# Patient Record
Sex: Female | Born: 1970 | Race: White | Hispanic: No | Marital: Married | Smoking: Never smoker
Health system: Southern US, Community
[De-identification: ages and names within clinical notes are randomized; demographics above are authoritative.]

## PROBLEM LIST (undated history)

## (undated) HISTORY — PX: SKIN CANCER EXCISION: SHX779

## (undated) HISTORY — PX: BREAST SURGERY: SHX581

## (undated) HISTORY — PX: ABDOMINAL HYSTERECTOMY: SHX81

## (undated) HISTORY — PX: CHOLECYSTECTOMY: SHX55

## (undated) HISTORY — PX: APPENDECTOMY: SHX54

---

## 2018-12-12 ENCOUNTER — Other Ambulatory Visit: Payer: Self-pay

## 2018-12-12 ENCOUNTER — Emergency Department (INDEPENDENT_AMBULATORY_CARE_PROVIDER_SITE_OTHER)
Admission: EM | Admit: 2018-12-12 | Discharge: 2018-12-12 | Disposition: A | Discharge status: Home / Self Care | Payer: No Typology Code available for payment source | Source: Home / Self Care | Attending: Family Medicine | Admitting: Family Medicine

## 2018-12-12 ENCOUNTER — Emergency Department (INDEPENDENT_AMBULATORY_CARE_PROVIDER_SITE_OTHER): Payer: No Typology Code available for payment source

## 2018-12-12 DIAGNOSIS — M549 Dorsalgia, unspecified: Secondary | ICD-10-CM

## 2018-12-12 DIAGNOSIS — R079 Chest pain, unspecified: Secondary | ICD-10-CM

## 2018-12-12 DIAGNOSIS — R110 Nausea: Secondary | ICD-10-CM

## 2018-12-12 DIAGNOSIS — R0789 Other chest pain: Secondary | ICD-10-CM

## 2018-12-12 DIAGNOSIS — R1013 Epigastric pain: Secondary | ICD-10-CM

## 2018-12-12 NOTE — ED Provider Notes (Signed)
[facility]    CSN: [id number] Arrival date & time: 12/12/18  1207      History   Chief Complaint Chief Complaint  Patient presents with  . Chest Pain    HPI Luticia Tadros is a 48 y.o. female.   HPI  Marta Bouie is a 48 y.o. female presenting to UC with c/o intermittent chest pain that woke her from her sleep about 10 days ago at 2AM. Pain was in the center lower part of her chest, sharp tight squeezing sensation, intense, radiated into her back and arm.  She initially thought she had slept wrong but the pain lasted for about 2 hours. Pt thinks at that point she was too afraid to fall back asleep. Denies breaking out into sweats, SOB or nausea at that time.  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  About 2 nights ago the pain woke her from her sleep again, associated mild nausea but no vomiting.  She has tried OTC famotidine and omeprazole w/o relief. She notes she has been taking increased dose of ibuprofen recently due to her back pain but not sure if that is the cause of her symptoms. Denies prior hx of heart problems and no family hx of heart problems. Her last physical was last August or September. She is in the process of finding another PCP due to insurance reasons.  She has never been diagnosed with high cholesterol, HTN or diabetes. Denies increased stress, recent illness or change in medications or OTC supplements.   Currently she has mild centralized lower chest pain, slightly to the left and into her back between her shoulders, 2/10.  Denies SOB, nausea, or abdominal pain. She had her gallbladder removed over 6 years ago as well as her appendix and a hysterectomy.     History reviewed. No pertinent past medical history.  There are no active problems to display for this patient.   Past Surgical History:  Procedure  Laterality Date  . ABDOMINAL HYSTERECTOMY    . APPENDECTOMY    . BREAST SURGERY    . CHOLECYSTECTOMY    . SKIN CANCER EXCISION      OB History   No obstetric history on file.      Home Medications    Prior to Admission medications   Not on File    Family History History reviewed. No pertinent family history.  Social History Social History   Tobacco Use  . Smoking status: Never Smoker  . Smokeless tobacco: Never Used  Substance Use Topics  . Alcohol use: Yes    Comment: 4-5 beer/ week  . Drug use: Not Currently     Allergies   Sulfa antibiotics   Review of Systems Review of Systems  Constitutional: Negative for chills, diaphoresis, fatigue and fever.  HENT: Negative for congestion, ear pain, sore throat, trouble swallowing and voice change.   Respiratory: Positive for chest tightness. Negative for cough, shortness of breath and wheezing.   Cardiovascular: Positive for chest pain. Negative for palpitations.  Gastrointestinal: Positive for nausea. Negative for abdominal pain, diarrhea and vomiting.  Musculoskeletal: Positive for back pain. Negative for arthralgias and myalgias.  Skin: Negative for rash.  Neurological: Negative for dizziness, light-headedness and headaches.     Physical Exam Triage Vital Signs ED Triage Vitals  Enc Vitals Group  BP 12/12/18 1224 123/78     Pulse Rate 12/12/18 1224 80     Resp 12/12/18 1224 20     Temp 12/12/18 1224 98.2 F (36.8 C)     Temp Source 12/12/18 1224 Temporal     SpO2 12/12/18 1224 97 %     Weight 12/12/18 1226 153 lb (69.4 kg)     Height 12/12/18 1226 5\' 4"  (1.626 m)     Head Circumference --      Peak Flow --      Pain Score 12/12/18 1226 2     Pain Loc --      Pain Edu? --      Excl. in GC? --    No data found.  Updated Vital Signs BP 123/78 (BP Location: Right Arm)   Pulse 80   Temp 98.2 F (36.8 C) (Temporal)   Resp 20   Ht 5\' 4"  (1.626 m)   Wt 153 lb (69.4 kg)   SpO2 97%   BMI 26.26  kg/m   Visual Acuity Right Eye Distance:   Left Eye Distance:   Bilateral Distance:    Right Eye Near:   Left Eye Near:    Bilateral Near:     Physical Exam Vitals signs and nursing note reviewed.  Constitutional:      General: She is not in acute distress.    Appearance: She is well-developed and normal weight. She is not ill-appearing, toxic-appearing or diaphoretic.  HENT:     Head: Normocephalic and atraumatic.  Neck:     Musculoskeletal: Normal range of motion.  Cardiovascular:     Rate and Rhythm: Normal rate and regular rhythm.     Heart sounds: Normal heart sounds.  Pulmonary:     Effort: Pulmonary effort is normal.     Breath sounds: No decreased breath sounds, wheezing, rhonchi or rales.  Abdominal:     Palpations: Abdomen is soft.     Tenderness: There is no abdominal tenderness.  Musculoskeletal: Normal range of motion.  Skin:    General: Skin is warm and dry.  Neurological:     Mental Status: She is alert and oriented to person, place, and time.  Psychiatric:        Behavior: Behavior normal.      UC Treatments / Results  Labs (all labs ordered are listed, but only abnormal results are displayed) Labs Reviewed - No data to display  EKG Date/Time:12/12/2018   12:35:36 Ventricular Rate: 65 PR Interval: 146 QRS Duration: 88 QT Interval: 386 QTC Calculation: 401 P-R-T axes: 56   62    52 Text Interpretation: Normal sinus rhythm. Normal ECG No prior to compare.  Radiology Dg Chest 2 View  Result Date: 12/12/2018 CLINICAL DATA:  48 year old female pain radiating into with history of chest the back for the past 1 and half weeks. EXAM: CHEST - 2 VIEW COMPARISON:  No priors. FINDINGS: Lung volumes are normal. No consolidative airspace disease. No pleural effusions. No pneumothorax. No pulmonary nodule or mass noted. Pulmonary vasculature and the cardiomediastinal silhouette are within normal limits. IMPRESSION: No radiographic evidence of acute  cardiopulmonary disease. Electronically Signed   By: 12/14/2018 M.D.   On: 12/12/2018 13:09    Procedures Procedures (including critical care time)  Medications Ordered in UC Medications - No data to display  Initial Impression / Assessment and Plan / UC Course  I have reviewed the triage vital signs and the nursing notes.  Pertinent labs & imaging results  that were available during my care of the patient were reviewed by me and considered in my medical decision making (see chart for details).     Reassured pt of normal EKG and vitals at this time. Pt is able to pinpoint location of chest pain, by pointing with 1 or 2 fingers but pain not reproducible with palpation. Given patient's age and combination of symptoms, recommend cardiology f/u despite normal EKG today. Especially because pt does not currently have a PCP for close f/u. No evidence of emergent process at this time, pt does not need to go to hospital today unless new or worsening symptoms develop. Discussed symptoms that warrant emergent care in the ED. Pt verbalized understanding and agreement with tx plan.  AVS provided   Final Clinical Impressions(s) / UC Diagnoses   Final diagnoses:  Nonspecific chest pain  Nausea without vomiting  Upper back pain  Abdominal pain, epigastric     Discharge Instructions      Although your EKG was normal today, it is recommended you have a more detailed cardiac workup.   A referral to cardiology has been placed.  Someone from a cardiology office should call you in about 1-2 days to schedule an appointment.  Please also try to follow up with a primary care provider for their guidance on recheck of symptoms and further workup if symptoms continue.  Call 911 or go to the hospital if symptoms worsening or new concerning symptoms develop- worsening chest pain, difficulty breathing, breaking out into sweats, vomiting, dizziness/passing out, etc.  No Primary Care Doctor: - Call  [facility] at  [phone] - they can help you locate a primary care doctor that  accepts your insurance, provides certain services, etc. - Physician Referral [phone]- [phone]     ED Prescriptions    None     PDMP not reviewed this encounter.   Lurene Shadowhelps, Ramiah Helfrich O, [place]-C 12/12/18 1453

## 2018-12-12 NOTE — Discharge Instructions (Signed)
°  Although your EKG was normal today, it is recommended you have a more detailed cardiac workup.   A referral to cardiology has been placed.  Someone from a cardiology office should call you in about 1-2 days to schedule an appointment.  Please also try to follow up with a primary care provider for their guidance on recheck of symptoms and further workup if symptoms continue.  Call 911 or go to the hospital if symptoms worsening or new concerning symptoms develop- worsening chest pain, difficulty breathing, breaking out into sweats, vomiting, dizziness/passing out, etc.  No Primary Care Doctor: Call [facility] at  [phone] - they can help you locate a primary care doctor that  accepts your insurance, provides certain services, etc. Physician Referral Service- [phone]

## 2018-12-12 NOTE — ED Triage Notes (Signed)
Chest pain a week ago Wednesday am around 2.  Arm felt funny, but thought it was from sleeping on arm.  Was not SOB, just intense pain.  Took pepto.  Stayed up a while, next day was not as bad.  Now pain in chest radiating to the back with slight nausea.

## 2018-12-22 ENCOUNTER — Ambulatory Visit (INDEPENDENT_AMBULATORY_CARE_PROVIDER_SITE_OTHER): Payer: No Typology Code available for payment source | Admitting: Cardiology

## 2018-12-22 ENCOUNTER — Other Ambulatory Visit: Payer: Self-pay

## 2018-12-22 ENCOUNTER — Encounter: Payer: Self-pay | Admitting: Cardiology

## 2018-12-22 VITALS — BP 118/72 | HR 71 | Ht 64.0 in | Wt 158.0 lb

## 2018-12-22 DIAGNOSIS — R079 Chest pain, unspecified: Secondary | ICD-10-CM

## 2018-12-22 DIAGNOSIS — Z13220 Encounter for screening for lipoid disorders: Secondary | ICD-10-CM | POA: Diagnosis not present

## 2018-12-22 DIAGNOSIS — R0789 Other chest pain: Secondary | ICD-10-CM | POA: Insufficient documentation

## 2018-12-22 DIAGNOSIS — Z1329 Encounter for screening for other suspected endocrine disorder: Secondary | ICD-10-CM | POA: Diagnosis not present

## 2018-12-22 NOTE — Addendum Note (Signed)
Addended by: Beckey Rutter on: 12/22/2018 05:00 PM   Modules accepted: Orders

## 2018-12-22 NOTE — Patient Instructions (Signed)
Medication Instructions:  Your physician recommends that you continue on your current medications as directed. Please refer to the Current Medication list given to you today.  *If you need a refill on your cardiac medications before your next appointment, please call your pharmacy*  Lab Work: Your physician recommends that you return FASTING to have a BMP, CBC, TSH, hepatic and lipid drawn   If you have labs (blood work) drawn today and your tests are completely normal, you will receive your results only by:  [place] (if you have MyChart) OR  A paper copy in the mail If you have any lab test that is abnormal or we need to change your treatment, we will call you to review the results.  Testing/Procedures: Your physician has requested that you have a lexiscan myoview. For further information please visit [email or web address]. Please follow instruction sheet, as given.  YOU will be contacted to schedule a CT calcium score at [address], [address], [place], [place]. THERE IS A $150 fee due upon arrival  Follow-Up: At [facility], you and your health needs are our priority.  As part of our continuing mission to provide you with exceptional heart care, we have created designated Provider Care Teams.  These Care Teams include your primary Cardiologist (physician) and Advanced Practice Providers (APPs -  Physician Assistants and Nurse Practitioners) who all work together to provide you with the care you need, when you need it.  Your next appointment:   6 months  The format for your next appointment:   In Person  Provider:   Jyl Heinz, MD  Other Instructions Regadenoson injection What is this medicine? REGADENOSON is used to test the heart for coronary artery disease. It is used in patients who can not exercise for their stress test. This medicine may be used for other purposes; ask your health care provider or pharmacist if you have questions. COMMON BRAND NAME(S):  Lexiscan What should I tell my health care provider before I take this medicine? They need to know if you have any of these conditions:  heart problems  lung or breathing disease, like asthma or COPD  an unusual or allergic reaction to regadenoson, other medicines, foods, dyes, or preservatives  pregnant or trying to get pregnant  breast-feeding How should I use this medicine? This medicine is for injection into a vein. It is given by a health care professional in a hospital or clinic setting. Talk to your pediatrician regarding the use of this medicine in children. Special care may be needed. Overdosage: If you think you have taken too much of this medicine contact a poison control center or emergency room at once. NOTE: This medicine is only for you. Do not share this medicine with others. What if I miss a dose? This does not apply. What may interact with this medicine?  caffeine  dipyridamole  guarana  theophylline This list may not describe all possible interactions. Give your health care provider a list of all the medicines, herbs, non-prescription drugs, or dietary supplements you use. Also tell them if you smoke, drink alcohol, or use illegal drugs. Some items may interact with your medicine. What should I watch for while using this medicine? Your condition will be monitored carefully while you are receiving this medicine. Do not take medicines, foods, or drinks with caffeine (like coffee, tea, or colas) for at least 12 hours before your test. If you do not know if something contains caffeine, ask your health care professional. What  side effects may I notice from receiving this medicine? Side effects that you should report to your doctor or health care professional as soon as possible:  allergic reactions like skin rash, itching or hives, swelling of the face, lips, or tongue  breathing problems  chest pain, tightness or palpitations  severe headache Side effects  that usually do not require medical attention (report to your doctor or health care professional if they continue or are bothersome):  flushing  headache  irritation or pain at site where injected  nausea, vomiting This list may not describe all possible side effects. Call your doctor for medical advice about side effects. You may report side effects to FDA at 1-800-FDA-1088. Where should I keep my medicine? This drug is given in a hospital or clinic and will not be stored at home. NOTE: This sheet is a summary. It may not cover all possible information. If you have questions about this medicine, talk to your doctor, pharmacist, or health care provider.  2020 Elsevier/Gold Standard (2007-10-11 15:08:13)   Cardiac Nuclear Scan A cardiac nuclear scan is a test that is done to check the flow of blood to your heart. It is done when you are resting and when you are exercising. The test looks for problems such as:  Not enough blood reaching a portion of the heart.  The heart muscle not working as it should. You may need this test if:  You have heart disease.  You have had lab results that are not normal.  You have had heart surgery or a balloon procedure to open up blocked arteries (angioplasty).  You have chest pain.  You have shortness of breath. In this test, a special dye (tracer) is put into your bloodstream. The tracer will travel to your heart. A camera will then take pictures of your heart to see how the tracer moves through your heart. This test is usually done at a hospital and takes 2-4 hours. Tell a doctor about:  Any allergies you have.  All medicines you are taking, including vitamins, herbs, eye drops, creams, and over-the-counter medicines.  Any problems you or family members have had with anesthetic medicines.  Any blood disorders you have.  Any surgeries you have had.  Any medical conditions you have.  Whether you are pregnant or may be pregnant. What are  the risks? Generally, this is a safe test. However, problems may occur, such as:  Serious chest pain and heart attack. This is only a risk if the stress portion of the test is done.  Rapid heartbeat.  A feeling of warmth in your chest. This feeling usually does not last long.  Allergic reaction to the tracer. What happens before the test?  Ask your doctor about changing or stopping your normal medicines. This is important.  Follow instructions from your doctor about what you cannot eat or drink.  Remove your jewelry on the day of the test. What happens during the test?  An IV tube will be inserted into one of your veins.  Your doctor will give you a small amount of tracer through the IV tube.  You will wait for 20-40 minutes while the tracer moves through your bloodstream.  Your heart will be monitored with an electrocardiogram (ECG).  You will lie down on an exam table.  Pictures of your heart will be taken for about 15-20 minutes.  You may also have a stress test. For this test, one of these things may be done: ? You  will be asked to exercise on a treadmill or a stationary bike. ? You will be given medicines that will make your heart work harder. This is done if you are unable to exercise.  When blood flow to your heart has peaked, a tracer will again be given through the IV tube.  After 20-40 minutes, you will get back on the exam table. More pictures will be taken of your heart.  Depending on the tracer that is used, more pictures may need to be taken 3-4 hours later.  Your IV tube will be removed when the test is over. The test may vary among doctors and hospitals. What happens after the test?  Ask your doctor: ? Whether you can return to your normal schedule, including diet, activities, and medicines. ? Whether you should drink more fluids. This will help to remove the tracer from your body. Drink enough fluid to keep your pee (urine) pale yellow.  Ask your  doctor, or the department that is doing the test: ? When will my results be ready? ? How will I get my results? Summary  A cardiac nuclear scan is a test that is done to check the flow of blood to your heart.  Tell your doctor whether you are pregnant or may be pregnant.  Before the test, ask your doctor about changing or stopping your normal medicines. This is important.  Ask your doctor whether you can return to your normal activities. You may be asked to drink more fluids. This information is not intended to replace advice given to you by your health care provider. Make sure you discuss any questions you have with your health care provider. Document Released: 07/27/2017 Document Revised: 06/02/2018 Document Reviewed: 07/27/2017 Elsevier Patient Education  2020 Elsevier Inc.  Coronary Calcium Scan A coronary calcium scan is an imaging test used to look for deposits of calcium and other fatty materials (plaques) in the inner lining of the blood vessels of the heart (coronary arteries). These deposits of calcium and plaques can partly clog and narrow the coronary arteries without producing any symptoms or warning signs. This puts a person at risk for a heart attack. This test can detect these deposits before symptoms develop. Tell a health care provider about:  Any allergies you have.  All medicines you are taking, including vitamins, herbs, eye drops, creams, and over-the-counter medicines.  Any problems you or family members have had with anesthetic medicines.  Any blood disorders you have.  Any surgeries you have had.  Any medical conditions you have.  Whether you are pregnant or may be pregnant. What are the risks? Generally, this is a safe procedure. However, problems may occur, including:  Harm to a pregnant woman and her unborn baby. This test involves the use of radiation. Radiation exposure can be dangerous to a pregnant woman and her unborn baby. If you are pregnant,  you generally should not have this procedure done.  Slight increase in the risk of cancer. This is because of the radiation involved in the test. What happens before the procedure? No preparation is needed for this procedure. What happens during the procedure?   You will undress and remove any jewelry around your neck or chest.  You will put on a hospital gown.  Sticky electrodes will be placed on your chest. The electrodes will be connected to an electrocardiogram (ECG) machine to record a tracing of the electrical activity of your heart.  A CT scanner will take pictures of your heart.  During this time, you will be asked to lie still and hold your breath for 2-3 seconds while a picture of your heart is being taken. The procedure may vary among health care providers and hospitals. What happens after the procedure?  You can get dressed.  You can return to your normal activities.  It is up to you to get the results of your test. Ask your health care provider, or the department that is doing the test, when your results will be ready. Summary  A coronary calcium scan is an imaging test used to look for deposits of calcium and other fatty materials (plaques) in the inner lining of the blood vessels of the heart (coronary arteries).  Generally, this is a safe procedure. Tell your health care provider if you are pregnant or may be pregnant.  No preparation is needed for this procedure.  A CT scanner will take pictures of your heart.  You can return to your normal activities after the scan is done. This information is not intended to replace advice given to you by your health care provider. Make sure you discuss any questions you have with your health care provider. Document Released: 08/09/2007 Document Revised: 01/23/2017 Document Reviewed: 12/31/2015 Elsevier Patient Education  2020 [employer].

## 2018-12-22 NOTE — Progress Notes (Signed)
Cardiology Office Note:    Date:  12/22/2018   ID:  Marissa Robertson, DOB 1970-07-20, MRN [id number]  PCP:  Patient, No Pcp Per  Cardiologist:  Garwin Brothers, MD   Referring MD: Lurene Shadow, PA-C    ASSESSMENT:    1. Chest discomfort   2. Screening cholesterol level    PLAN:    In order of problems listed above:  1. Chest discomfort: The patient symptoms are atypical for coronary etiology.  She does not have much in terms of risk factors for coronary artery disease.  She appears significantly anxious.  I reassured her about my findings today.  To make sure she has no element of obstructive coronary artery disease we will do a Lexiscan sestamibi.  Also she has not had any blood work and in the next few days we will get her in for fasting blood work including lipids for his stratification. 2. Her blood pressure is stable 3. I also discussed with her about calcium scoring for risk stratification and she is agreeable. 4. Patient will be seen in follow-up appointment in 6 months or earlier if the patient has any concerns.  She knows to go to the nearest emergency room for any concerning symptoms.   Medication Adjustments/Labs and Tests Ordered: Current medicines are reviewed at length with the patient today.  Concerns regarding medicines are outlined above.  No orders of the defined types were placed in this encounter.  No orders of the defined types were placed in this encounter.    History of Present Illness:    Marissa Robertson is a 48 y.o. female who is being seen today for the evaluation of chest discomfort at the request of Rolla Plate.  Patient is a pleasant 48 year old female.  She is overall in good health.  She mentions to me that she experience chest discomfort and woke up in the night about 3 weeks ago.  Subsequently she has had no significant issues.  No orthopnea or PND.  No radiation to any part of the body.  She started walking since and walked about half  an hour yesterday without any problems.  The previous day when she walked she had chest discomfort.  At the time of my evaluation, the patient is alert awake oriented and in no distress.  History reviewed. No pertinent past medical history.  Past Surgical History:  Procedure Laterality Date  . ABDOMINAL HYSTERECTOMY    . APPENDECTOMY    . BREAST SURGERY    . CHOLECYSTECTOMY    . SKIN CANCER EXCISION      Current Medications: No outpatient medications have been marked as taking for the 12/22/18 encounter (Office Visit) with Revankar, Aundra Dubin, MD.     Allergies:   Sulfa antibiotics   Social History   Socioeconomic History  . Marital status: Married    Spouse name: Not on file  . Number of children: Not on file  . Years of education: Not on file  . Highest education level: Not on file  Occupational History  . Not on file  Social Needs  . Financial resource strain: Not on file  . Food insecurity    Worry: Not on file    Inability: Not on file  . Transportation needs    Medical: Not on file    Non-medical: Not on file  Tobacco Use  . Smoking status: Never Smoker  . Smokeless tobacco: Never Used  Substance and Sexual Activity  . Alcohol use: Yes  Comment: 4-5 beer/ week  . Drug use: Not Currently  . Sexual activity: Not on file  Lifestyle  . Physical activity    Days per week: Not on file    Minutes per session: Not on file  . Stress: Not on file  Relationships  . Social Herbalist on phone: Not on file    Gets together: Not on file    Attends religious service: Not on file    Active member of club or organization: Not on file    Attends meetings of clubs or organizations: Not on file    Relationship status: Not on file  Other Topics Concern  . Not on file  Social History Narrative  . Not on file     Family History: The patient's family history is not on file.  ROS:   Please see the history of present illness.    All other systems reviewed  and are negative.  EKGs/Labs/Other Studies Reviewed:    The following studies were reviewed today: EKG was sinus rhythm and nonspecific ST-T changes.   Recent Labs: No results found for requested labs within last 8760 hours.  Recent Lipid Panel No results found for: CHOL, TRIG, HDL, CHOLHDL, VLDL, LDLCALC, LDLDIRECT  Physical Exam:    VS:  BP 118/72 (BP Location: Left Arm, Patient Position: Sitting, Cuff Size: Normal)   Pulse 71   Ht 5\' 4"  (1.626 m)   Wt 158 lb (71.7 kg)   SpO2 99%   BMI 27.12 kg/m     Wt Readings from Last 3 Encounters:  12/22/18 158 lb (71.7 kg)  12/12/18 153 lb (69.4 kg)     GEN: Patient is in no acute distress HEENT: Normal NECK: No JVD; No carotid bruits LYMPHATICS: No lymphadenopathy CARDIAC: S1 S2 regular, 2/6 systolic murmur at the apex. RESPIRATORY:  Clear to auscultation without rales, wheezing or rhonchi  ABDOMEN: Soft, non-tender, non-distended MUSCULOSKELETAL:  No edema; No deformity  SKIN: Warm and dry NEUROLOGIC:  Alert and oriented x 3 PSYCHIATRIC:  Normal affect    Signed, Jenean Lindau, MD  12/22/2018 4:47 PM    [facility]

## 2018-12-23 ENCOUNTER — Encounter (HOSPITAL_COMMUNITY): Payer: No Typology Code available for payment source

## 2018-12-30 ENCOUNTER — Telehealth: Payer: Self-pay | Admitting: *Deleted

## 2018-12-30 NOTE — Telephone Encounter (Signed)
Called pt to schedule CT CA Score for Dr. Geraldo Pitter not able to leave message voicemail full.

## 2018-12-31 ENCOUNTER — Telehealth: Payer: Self-pay

## 2018-12-31 NOTE — Telephone Encounter (Signed)
Left message for patient to call office to discuss calcium score scheduled.

## 2018-12-31 NOTE — Telephone Encounter (Signed)
-----   Message from Osvaldo Shipper, [place] sent at 12/30/2018  9:41 AM EST ----- Regarding: RE: Please call patient and schedule CT calcium score Katilyn Miltenberger   The above patients voicemail is full not sure if you want to mail a letter for her to call me directly to schedule he CT CA Score. [phone]  Erline Levine  ----- Message ----- From: Beckey Rutter, RN Sent: 12/22/2018   4:58 PM EST To: Osvaldo Shipper, [place] Subject: Please call patient and schedule CT calcium #

## 2019-02-02 ENCOUNTER — Telehealth (HOSPITAL_COMMUNITY): Payer: Self-pay

## 2019-02-02 NOTE — Telephone Encounter (Signed)
Encounter complete. 

## 2019-02-04 ENCOUNTER — Encounter (HOSPITAL_COMMUNITY): Payer: No Typology Code available for payment source

## 2020-08-25 ENCOUNTER — Emergency Department (HOSPITAL_BASED_OUTPATIENT_CLINIC_OR_DEPARTMENT_OTHER): Payer: 59

## 2020-08-25 ENCOUNTER — Other Ambulatory Visit: Payer: Self-pay

## 2020-08-25 ENCOUNTER — Emergency Department (HOSPITAL_BASED_OUTPATIENT_CLINIC_OR_DEPARTMENT_OTHER)
Admission: EM | Admit: 2020-08-25 | Discharge: 2020-08-25 | Disposition: A | Discharge status: Home / Self Care | Payer: 59 | Attending: Emergency Medicine | Admitting: Emergency Medicine

## 2020-08-25 ENCOUNTER — Encounter (HOSPITAL_BASED_OUTPATIENT_CLINIC_OR_DEPARTMENT_OTHER): Payer: Self-pay | Admitting: Emergency Medicine

## 2020-08-25 DIAGNOSIS — W108XXA Fall (on) (from) other stairs and steps, initial encounter: Secondary | ICD-10-CM | POA: Diagnosis not present

## 2020-08-25 DIAGNOSIS — Y92094 Garage of other non-institutional residence as the place of occurrence of the external cause: Secondary | ICD-10-CM | POA: Insufficient documentation

## 2020-08-25 DIAGNOSIS — S99912A Unspecified injury of left ankle, initial encounter: Secondary | ICD-10-CM | POA: Diagnosis present

## 2020-08-25 DIAGNOSIS — S93402A Sprain of unspecified ligament of left ankle, initial encounter: Secondary | ICD-10-CM | POA: Diagnosis not present

## 2020-08-25 DIAGNOSIS — Y9301 Activity, walking, marching and hiking: Secondary | ICD-10-CM | POA: Insufficient documentation

## 2020-08-25 MED ORDER — TRAMADOL HCL 50 MG PO TABS: 50.0000 mg | ORAL_TABLET | Freq: Four times a day (QID) | ORAL | 0 refills | Status: AC | PRN

## 2020-08-25 MED ORDER — IBUPROFEN 800 MG PO TABS: 800.0000 mg | ORAL_TABLET | Freq: Three times a day (TID) | ORAL | 0 refills | Status: AC | PRN

## 2020-08-25 MED ORDER — KETOROLAC TROMETHAMINE 30 MG/ML IJ SOLN
30.0000 mg | Freq: Once | INTRAMUSCULAR | Status: AC
  Administered 2020-08-25: 30 mg via INTRAMUSCULAR
  Filled 2020-08-25: qty 1

## 2020-08-25 NOTE — Discharge Instructions (Addendum)
You were seen in the emergency room today with pain in the ankle.  I suspect you have a sprain.  The x-ray did not show fracture.  You may use the crutches as needed for comfort.  Called in some medications to the pharmacy to help with pain.  Please follow with your primary care doctor.  Have also listed the name of a sports medicine doctor in the form for follow-up in the next several weeks if your pain is persistent.

## 2020-08-25 NOTE — ED Provider Notes (Signed)
Emergency Department Provider Note   I have reviewed the triage vital signs and the nursing notes.   HISTORY  Chief Complaint Leg Pain   HPI Marissa Robertson is a 50 y.o. female with past medical history reviewed below presents to the emergency department with left ankle pain and swelling.  The patient was walking down steps in the garage when her right foot slipped and the left leg folded underneath her.  She landed on the step with her left leg pinned under her.  She has pain in the left shin and left ankle with some swelling.  She took a tramadol prior to arrival with no significant relief in pain symptoms.  She is not having pain in the left knee or hip.  No back pain. Pain is moderate and worse with movement.   History reviewed. No pertinent past medical history.  Patient Active Problem List   Diagnosis Date Noted   Chest discomfort 12/22/2018   Screening cholesterol level 12/22/2018    Past Surgical History:  Procedure Laterality Date   ABDOMINAL HYSTERECTOMY     APPENDECTOMY     BREAST SURGERY     CHOLECYSTECTOMY     SKIN CANCER EXCISION      Allergies Sulfa antibiotics  No family history on file.  Social History Social History   Tobacco Use   Smoking status: Never   Smokeless tobacco: Never  Substance Use Topics   Alcohol use: Not Currently   Drug use: Not Currently    Review of Systems  Constitutional: No fever/chills Musculoskeletal: Negative for back pain. Positive left ankle pain/swelling.  Skin: Negative for rash. Neurological: Negative for headaches. No numbness.   ____________________________________________   PHYSICAL EXAM:  VITAL SIGNS: ED Triage Vitals  Enc Vitals Group     BP 08/25/20 2143 115/69     Pulse Rate 08/25/20 2143 71     Resp --      Temp 08/25/20 2143 98.5 F (36.9 C)     Temp Source 08/25/20 2143 Oral     SpO2 08/25/20 2143 98 %     Weight 08/25/20 2136 160 lb (72.6 kg)     Height 08/25/20 2136 5\' 4"  (1.626 m)    Constitutional: Alert and oriented. Well appearing and in no acute distress. Eyes: Conjunctivae are normal.  Head: Atraumatic. Nose: No congestion/rhinnorhea. Mouth/Throat: Mucous membranes are moist.  Neck: No stridor.  Cardiovascular: Good peripheral circulation. Respiratory: Normal respiratory effort.  Gastrointestinal: No distention.  Musculoskeletal: Swelling and tenderness over the left lateral malleolus.  There is some abrasion to the distal, anterior left lower leg. No laceration.  No proximal fibular tenderness. Neurologic:  Normal speech and language.  Skin:  Skin is warm, dry and intact. No rash noted.  ____________________________________________  RADIOLOGY  DG Tibia/Fibula Left  Result Date: 08/25/2020 CLINICAL DATA:  Fall down 3 steps in garage, left lower extremity pain EXAM: LEFT ANKLE COMPLETE - 3+ VIEW; LEFT TIBIA AND FIBULA - 2 VIEW COMPARISON:  None FINDINGS: Tibia and fibula are intact. Alignment at the knee is grossly maintained. No acute soft tissue swelling, gas or foreign body. No acute fracture or traumatic malalignment at the level of the ankle. Ankle mortise is congruent oblique radiographs. Included portion of the mid and hindfoot are unremarkable. No sizable ankle effusion or significant swelling. IMPRESSION: No acute fracture or traumatic malalignment of the tibia/fibula or ankle. Electronically Signed   By: 10/26/2020 M.D.   On: 08/25/2020 22:40   DG Ankle Complete  Left  Result Date: 08/25/2020 CLINICAL DATA:  Fall down 3 steps in garage, left lower extremity pain EXAM: LEFT ANKLE COMPLETE - 3+ VIEW; LEFT TIBIA AND FIBULA - 2 VIEW COMPARISON:  None FINDINGS: Tibia and fibula are intact. Alignment at the knee is grossly maintained. No acute soft tissue swelling, gas or foreign body. No acute fracture or traumatic malalignment at the level of the ankle. Ankle mortise is congruent oblique radiographs. Included portion of the mid and hindfoot are unremarkable.  No sizable ankle effusion or significant swelling. IMPRESSION: No acute fracture or traumatic malalignment of the tibia/fibula or ankle. Electronically Signed   By: Kreg Shropshire M.D.   On: 08/25/2020 22:40    ____________________________________________   PROCEDURES  Procedure(s) performed:   Procedures  None  ____________________________________________   INITIAL IMPRESSION / ASSESSMENT AND PLAN / ED COURSE  Pertinent labs & imaging results that were available during my care of the patient were reviewed by me and considered in my medical decision making (see chart for details).   Patient presents emergency department evaluation of left ankle and leg pain after a fall this evening.  No open fractures.  No proximal fibular tenderness.  Plan for plain films of the left ankle and tib-fib for evaluation of underlying fracture.  Plain films reviewed. No acute findings. Plan for RICE and crutches with sports med follow up PRN.  ____________________________________________  FINAL CLINICAL IMPRESSION(S) / ED DIAGNOSES  Final diagnoses:  Sprain of left ankle, unspecified ligament, initial encounter     MEDICATIONS GIVEN DURING THIS VISIT:  Medications  ketorolac (TORADOL) 30 MG/ML injection 30 mg (30 mg Intramuscular Given 08/25/20 2158)     NEW OUTPATIENT MEDICATIONS STARTED DURING THIS VISIT:  Discharge Medication List as of 08/25/2020 10:50 PM     START taking these medications   Details  ibuprofen (ADVIL) 800 MG tablet Take 1 tablet (800 mg total) by mouth every 8 (eight) hours as needed for moderate pain., Starting Sat 08/25/2020, Normal    traMADol (ULTRAM) 50 MG tablet Take 1 tablet (50 mg total) by mouth every 6 (six) hours as needed., Starting Sat 08/25/2020, Normal        Note:  This document was prepared using Dragon voice recognition software and may include unintentional dictation errors.  Alona Bene, MD, [facility]    Akirra Lacerda, Arlyss Repress, MD 08/26/20  [phone]

## 2020-08-25 NOTE — ED Triage Notes (Signed)
Pt fell down about 3 steps in garage tonight; c/o LLE pain

## 2022-12-07 IMAGING — DX DG ANKLE COMPLETE 3+V*L*
3 series · 3 of 3 positions shown · non-contrast
Comparison: None

CLINICAL DATA: Fall down 3 steps in garage, left lower extremity
pain

EXAM:
LEFT ANKLE COMPLETE - 3+ VIEW; LEFT TIBIA AND FIBULA - 2 VIEW

[ankle ap]
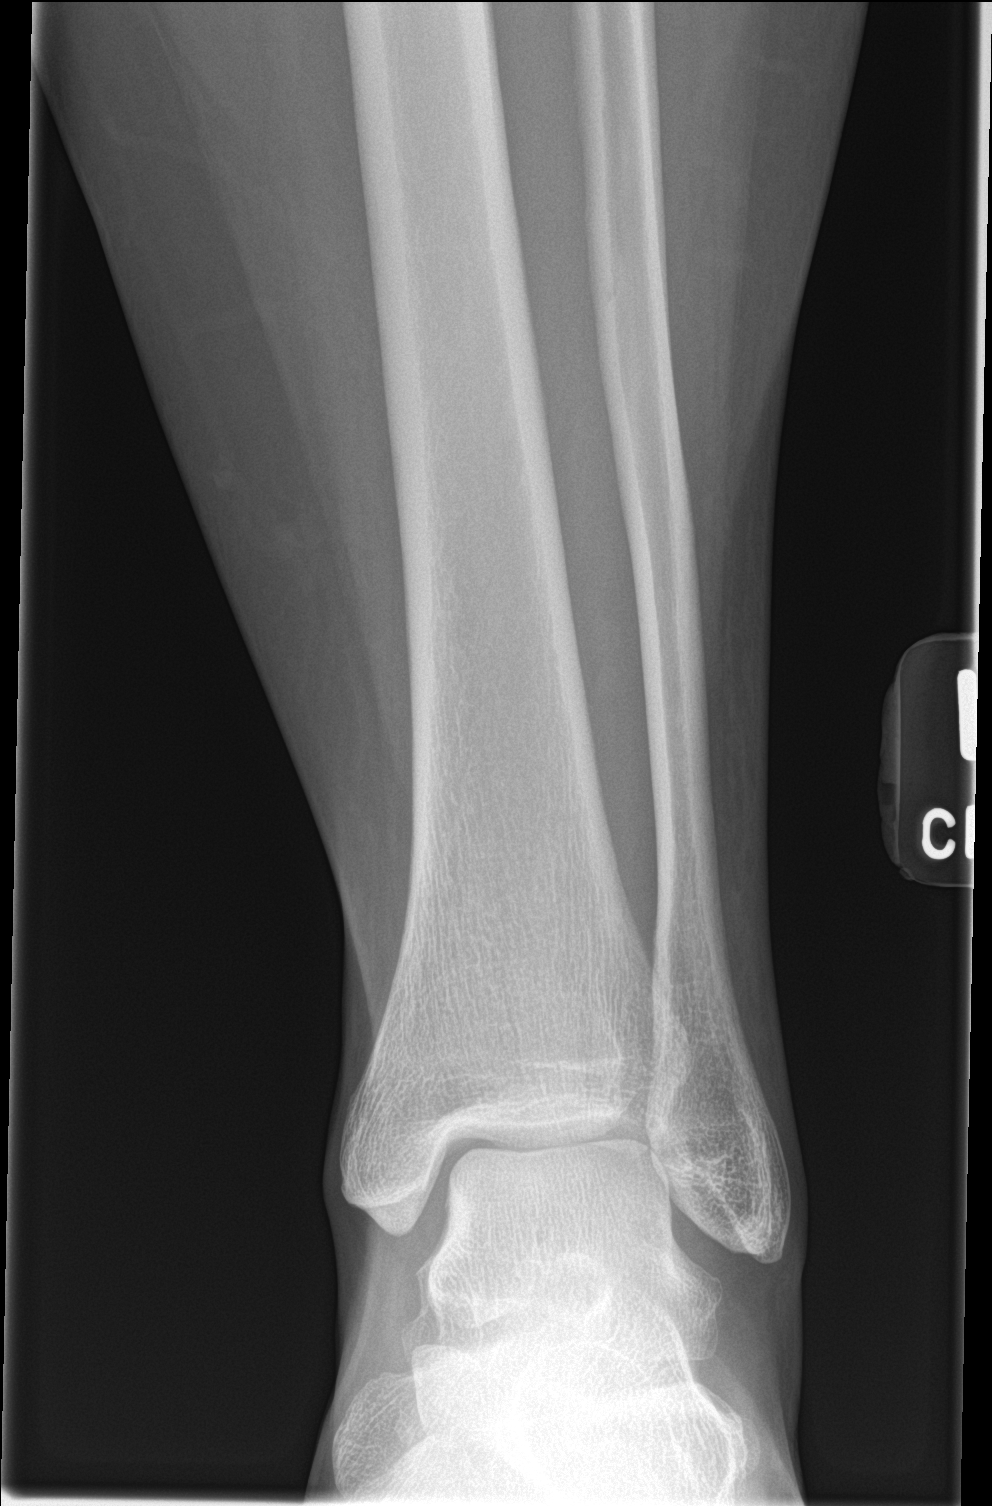

[ankle obl]
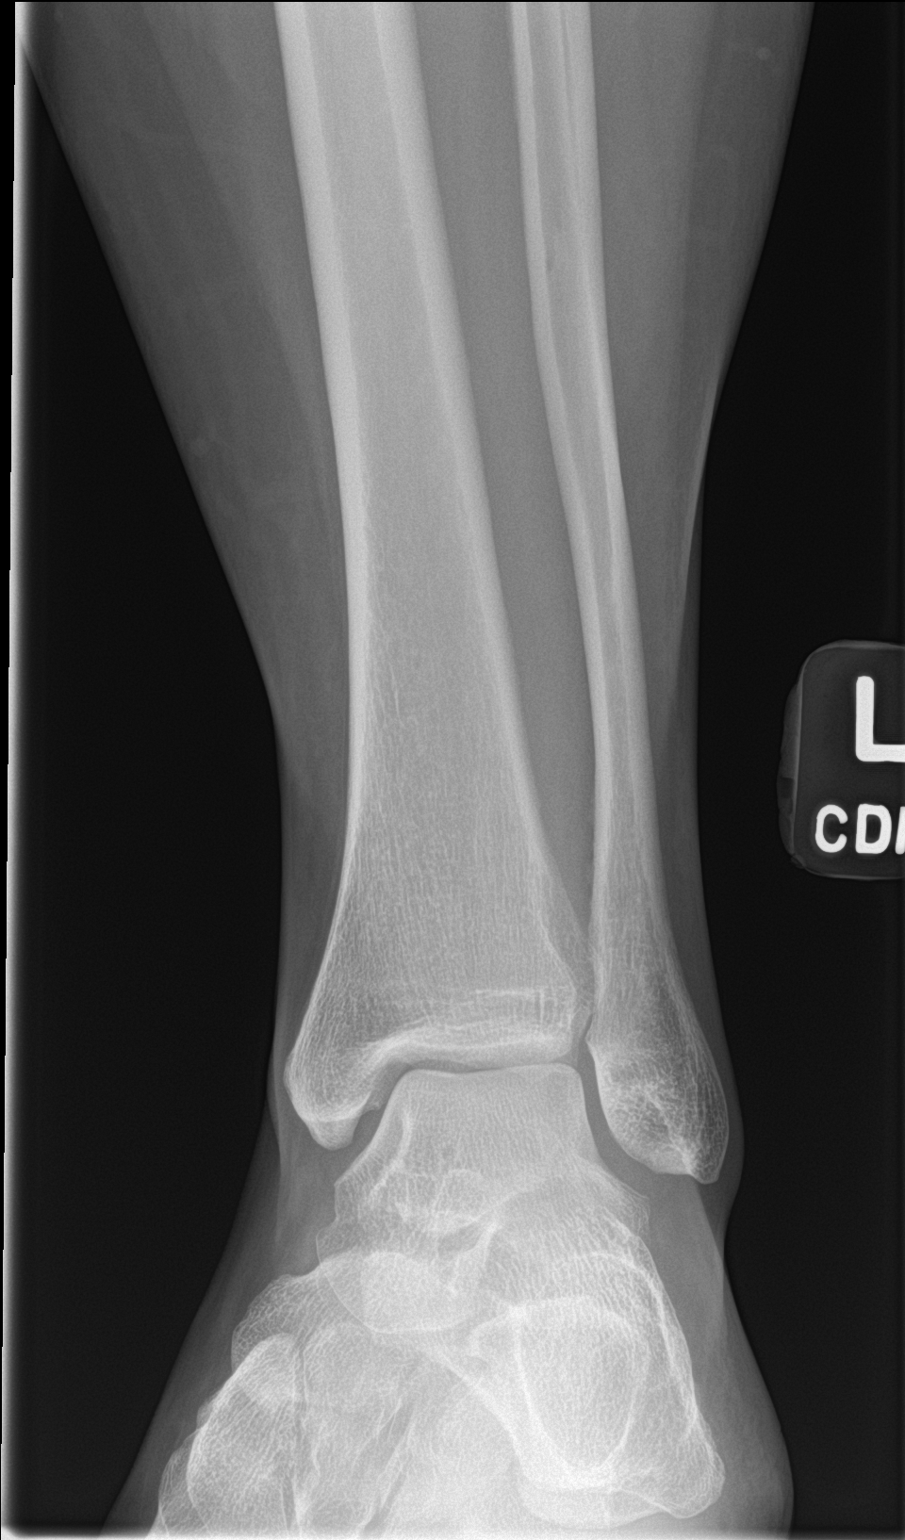

[ankle lat]
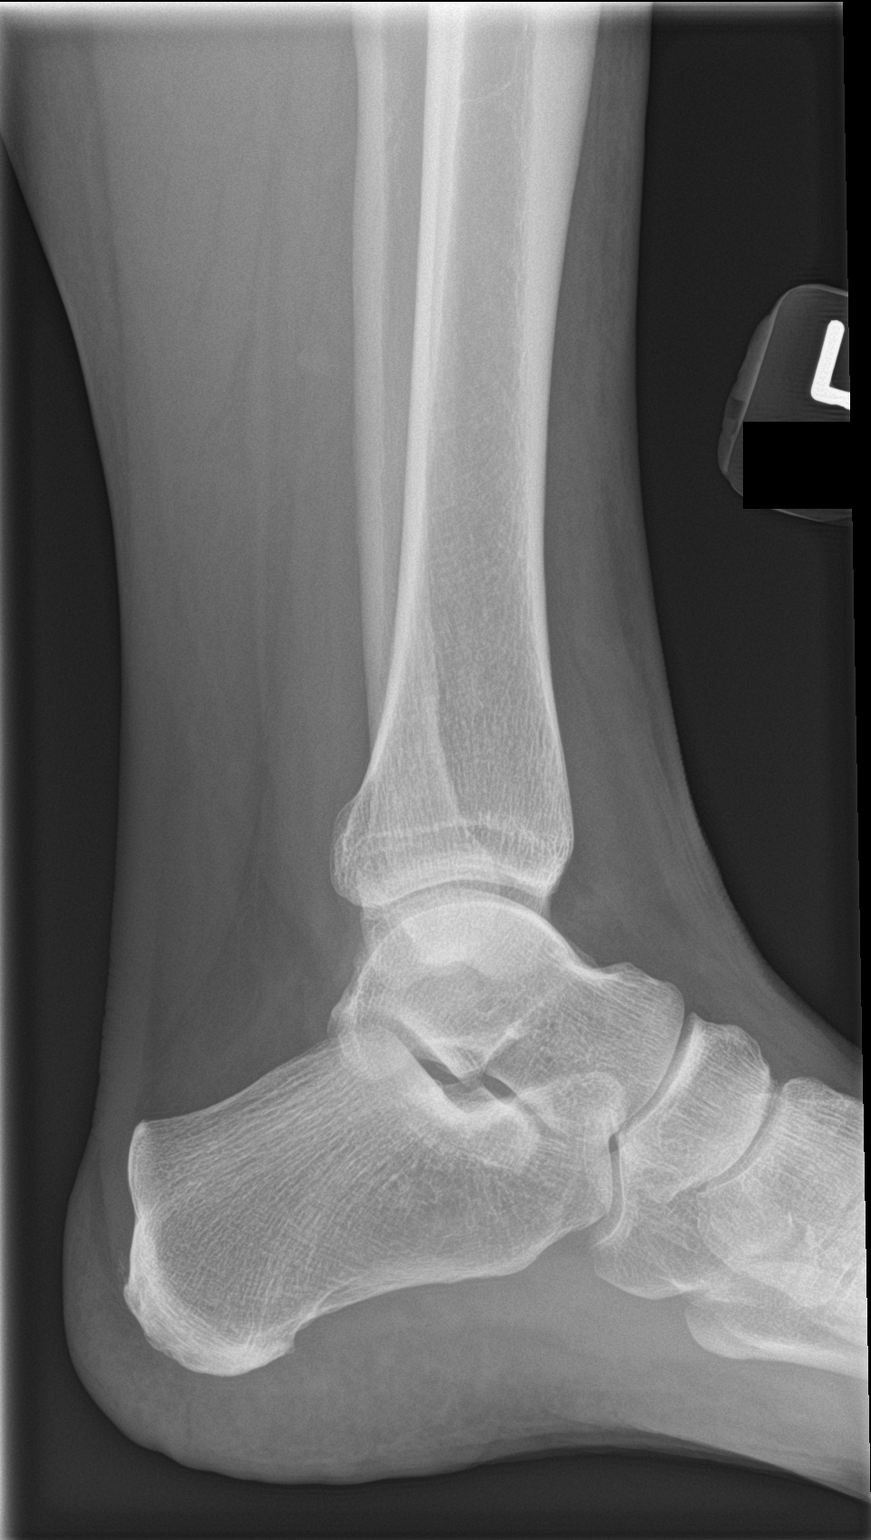

[3 of 3 positions shown; findings below may reference images not displayed]

FINDINGS: Tibia and fibula are intact. Alignment at the knee is grossly
maintained. No acute soft tissue swelling, gas or foreign body.

No acute fracture or traumatic malalignment at the level of the
ankle. Ankle mortise is congruent oblique radiographs. Included
portion of the mid and hindfoot are unremarkable. No sizable ankle
effusion or significant swelling.
IMPRESSION: No acute fracture or traumatic malalignment of the tibia/fibula or
ankle.

## 2022-12-07 IMAGING — DX DG TIBIA/FIBULA 2V*L*
2 series · 2 of 2 positions shown · non-contrast
Comparison: None

CLINICAL DATA: Fall down 3 steps in garage, left lower extremity
pain

EXAM:
LEFT ANKLE COMPLETE - 3+ VIEW; LEFT TIBIA AND FIBULA - 2 VIEW

[tibia ap]
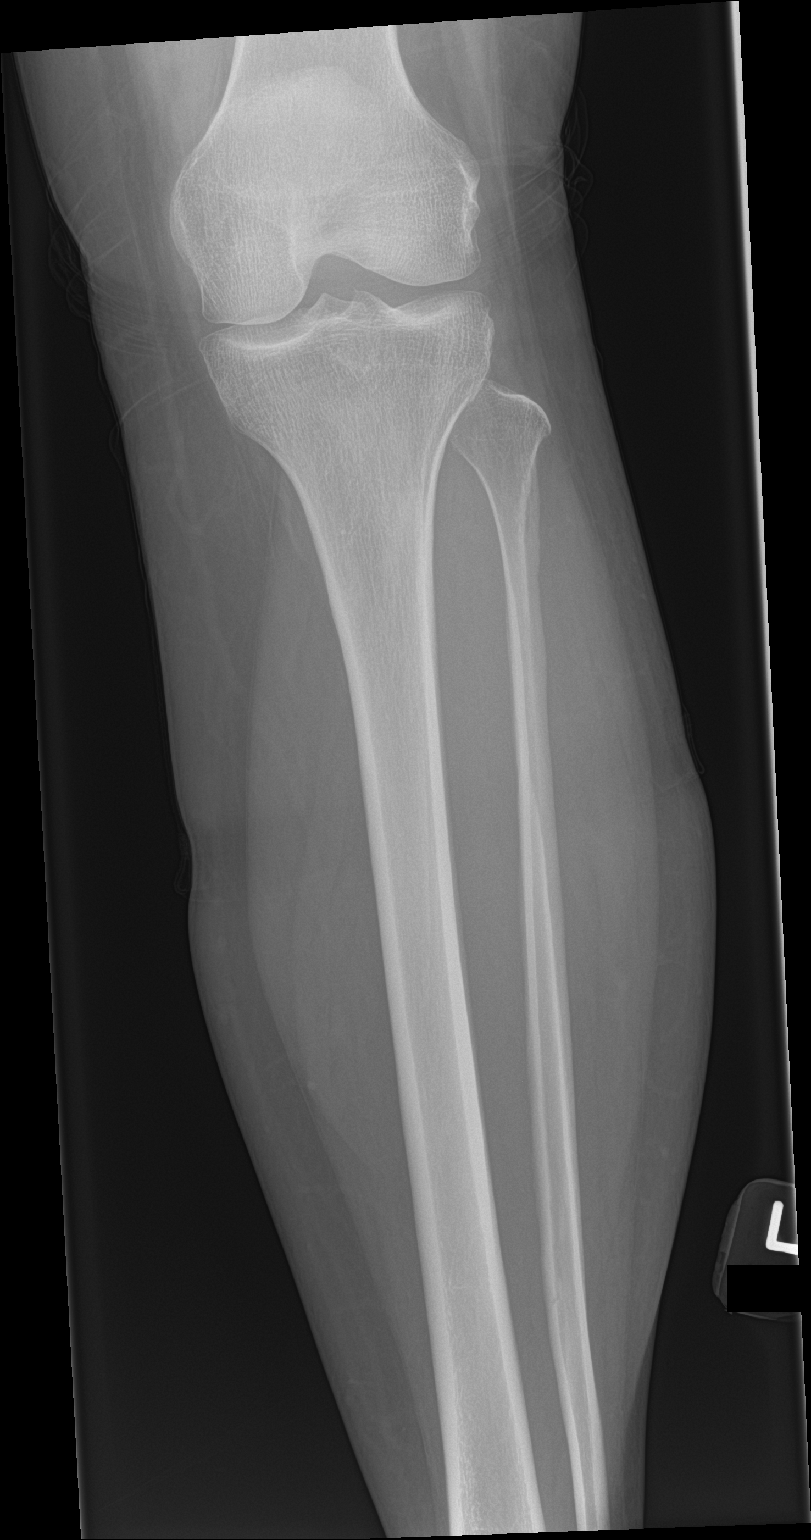

[tibia lat]
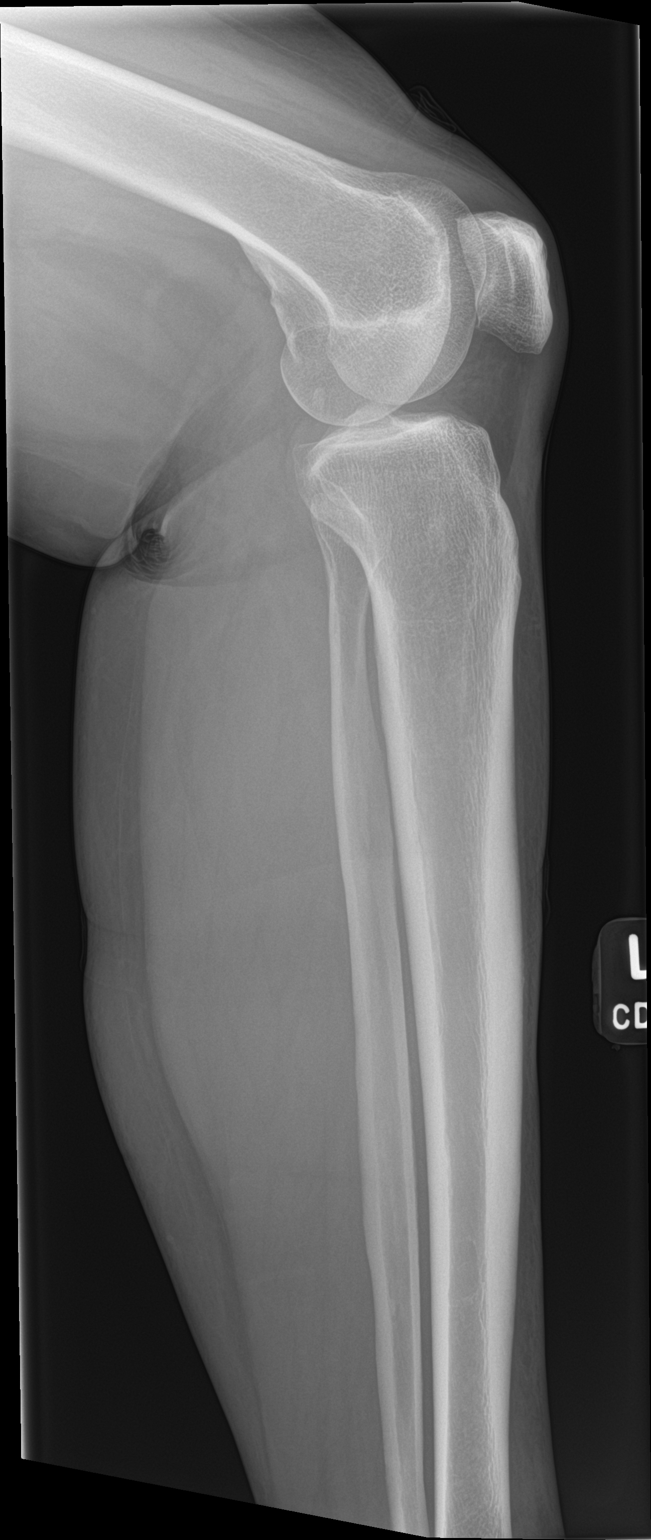

[2 of 2 positions shown; findings below may reference images not displayed]

FINDINGS: Tibia and fibula are intact. Alignment at the knee is grossly
maintained. No acute soft tissue swelling, gas or foreign body.

No acute fracture or traumatic malalignment at the level of the
ankle. Ankle mortise is congruent oblique radiographs. Included
portion of the mid and hindfoot are unremarkable. No sizable ankle
effusion or significant swelling.
IMPRESSION: No acute fracture or traumatic malalignment of the tibia/fibula or
ankle.
